# Patient Record
Sex: Female | Born: 2000 | Hispanic: Yes | Marital: Single | Smoking: Never smoker
Health system: Southern US, Community
[De-identification: ages and names within clinical notes are randomized; demographics above are authoritative.]

## PROBLEM LIST (undated history)

## (undated) DIAGNOSIS — G4730 Sleep apnea, unspecified: Secondary | ICD-10-CM

## (undated) DIAGNOSIS — R519 Headache, unspecified: Secondary | ICD-10-CM

## (undated) DIAGNOSIS — J45909 Unspecified asthma, uncomplicated: Secondary | ICD-10-CM

## (undated) DIAGNOSIS — Z973 Presence of spectacles and contact lenses: Secondary | ICD-10-CM

## (undated) HISTORY — DX: Headache, unspecified: R51.9

---
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
## 2022-02-20 ENCOUNTER — Other Ambulatory Visit: Payer: Self-pay | Admitting: Family Medicine

## 2023-06-16 ENCOUNTER — Other Ambulatory Visit (HOSPITAL_COMMUNITY): Payer: Self-pay

## 2023-06-16 MED ORDER — BUPROPION HCL ER (XL) 150 MG PO TB24
150.0000 mg | ORAL_TABLET | Freq: Every morning | ORAL | 3 refills | Status: AC
  Filled 2023-06-16: qty 90, 90d supply, fill #0

## 2023-06-26 ENCOUNTER — Other Ambulatory Visit (HOSPITAL_COMMUNITY): Payer: Self-pay
